# Patient Record
Sex: Female | Born: 1960 | Race: White | Hispanic: No | Smoking: Never smoker
Health system: Southern US, Community
[De-identification: ages and names within clinical notes are randomized; demographics above are authoritative.]

---

## 1982-11-02 HISTORY — PX: BREAST EXCISIONAL BIOPSY: SUR124

## 1999-07-17 ENCOUNTER — Other Ambulatory Visit: Admission: RE | Admit: 1999-07-17 | Discharge: 1999-07-17 | Payer: Self-pay | Admitting: Gynecology

## 2000-07-22 ENCOUNTER — Other Ambulatory Visit: Admission: RE | Admit: 2000-07-22 | Discharge: 2000-07-22 | Payer: Self-pay | Admitting: Gynecology

## 2001-04-22 ENCOUNTER — Encounter: Payer: Self-pay | Admitting: Gynecology

## 2001-04-22 ENCOUNTER — Encounter: Admission: RE | Admit: 2001-04-22 | Discharge: 2001-04-22 | Payer: Self-pay | Admitting: Gynecology

## 2001-08-10 ENCOUNTER — Other Ambulatory Visit: Admission: RE | Admit: 2001-08-10 | Discharge: 2001-08-10 | Payer: Self-pay | Admitting: Gynecology

## 2002-04-25 ENCOUNTER — Encounter: Admission: RE | Admit: 2002-04-25 | Discharge: 2002-04-25 | Payer: Self-pay | Admitting: Gynecology

## 2002-04-25 ENCOUNTER — Encounter: Payer: Self-pay | Admitting: Gynecology

## 2002-08-10 ENCOUNTER — Other Ambulatory Visit: Admission: RE | Admit: 2002-08-10 | Discharge: 2002-08-10 | Payer: Self-pay | Admitting: Gynecology

## 2003-03-05 ENCOUNTER — Encounter: Payer: Self-pay | Admitting: Family Medicine

## 2003-03-05 ENCOUNTER — Encounter: Admission: RE | Admit: 2003-03-05 | Discharge: 2003-03-05 | Payer: Self-pay | Admitting: Family Medicine

## 2003-07-24 ENCOUNTER — Encounter: Admission: RE | Admit: 2003-07-24 | Discharge: 2003-07-24 | Payer: Self-pay | Admitting: Gynecology

## 2003-07-24 ENCOUNTER — Encounter: Payer: Self-pay | Admitting: Gynecology

## 2003-08-22 ENCOUNTER — Other Ambulatory Visit: Admission: RE | Admit: 2003-08-22 | Discharge: 2003-08-22 | Payer: Self-pay | Admitting: Gynecology

## 2004-08-26 ENCOUNTER — Other Ambulatory Visit: Admission: RE | Admit: 2004-08-26 | Discharge: 2004-08-26 | Payer: Self-pay | Admitting: Gynecology

## 2004-09-08 ENCOUNTER — Ambulatory Visit (HOSPITAL_COMMUNITY): Admission: RE | Admit: 2004-09-08 | Discharge: 2004-09-08 | Payer: Self-pay | Admitting: Gynecology

## 2005-09-30 ENCOUNTER — Encounter: Admission: RE | Admit: 2005-09-30 | Discharge: 2005-09-30 | Payer: Self-pay | Admitting: Gynecology

## 2005-10-05 ENCOUNTER — Other Ambulatory Visit: Admission: RE | Admit: 2005-10-05 | Discharge: 2005-10-05 | Payer: Self-pay | Admitting: Gynecology

## 2006-11-05 ENCOUNTER — Encounter: Admission: RE | Admit: 2006-11-05 | Discharge: 2006-11-05 | Payer: Self-pay | Admitting: Gynecology

## 2006-11-10 ENCOUNTER — Other Ambulatory Visit: Admission: RE | Admit: 2006-11-10 | Discharge: 2006-11-10 | Payer: Self-pay | Admitting: Gynecology

## 2006-11-16 ENCOUNTER — Encounter: Admission: RE | Admit: 2006-11-16 | Discharge: 2006-11-16 | Payer: Self-pay | Admitting: Gynecology

## 2007-11-30 ENCOUNTER — Other Ambulatory Visit: Admission: RE | Admit: 2007-11-30 | Discharge: 2007-11-30 | Payer: Self-pay | Admitting: Gynecology

## 2007-12-14 ENCOUNTER — Encounter: Admission: RE | Admit: 2007-12-14 | Discharge: 2007-12-14 | Payer: Self-pay | Admitting: Gynecology

## 2007-12-16 ENCOUNTER — Encounter: Admission: RE | Admit: 2007-12-16 | Discharge: 2007-12-16 | Payer: Self-pay | Admitting: Family Medicine

## 2008-11-07 ENCOUNTER — Other Ambulatory Visit: Admission: RE | Admit: 2008-11-07 | Discharge: 2008-11-07 | Payer: Self-pay | Admitting: Gynecology

## 2008-12-24 ENCOUNTER — Encounter: Admission: RE | Admit: 2008-12-24 | Discharge: 2008-12-24 | Payer: Self-pay | Admitting: Gynecology

## 2010-01-01 ENCOUNTER — Encounter: Admission: RE | Admit: 2010-01-01 | Discharge: 2010-01-01 | Payer: Self-pay | Admitting: Gynecology

## 2011-01-02 ENCOUNTER — Other Ambulatory Visit (HOSPITAL_COMMUNITY): Payer: Self-pay | Admitting: Obstetrics and Gynecology

## 2011-01-02 DIAGNOSIS — Z1231 Encounter for screening mammogram for malignant neoplasm of breast: Secondary | ICD-10-CM

## 2011-01-14 ENCOUNTER — Ambulatory Visit
Admission: RE | Admit: 2011-01-14 | Discharge: 2011-01-14 | Disposition: A | Discharge status: Home / Self Care | Payer: BC Managed Care – PPO | Source: Ambulatory Visit | Attending: Obstetrics and Gynecology | Admitting: Obstetrics and Gynecology

## 2011-01-14 DIAGNOSIS — Z1231 Encounter for screening mammogram for malignant neoplasm of breast: Secondary | ICD-10-CM

## 2011-05-12 ENCOUNTER — Other Ambulatory Visit: Payer: Self-pay | Admitting: Neurology

## 2011-05-12 DIAGNOSIS — R0989 Other specified symptoms and signs involving the circulatory and respiratory systems: Secondary | ICD-10-CM

## 2011-05-20 ENCOUNTER — Ambulatory Visit
Admission: RE | Admit: 2011-05-20 | Discharge: 2011-05-20 | Disposition: A | Discharge status: Home / Self Care | Payer: BC Managed Care – PPO | Source: Ambulatory Visit | Attending: Neurology | Admitting: Neurology

## 2011-05-20 ENCOUNTER — Other Ambulatory Visit: Payer: BC Managed Care – PPO

## 2011-05-20 DIAGNOSIS — R0989 Other specified symptoms and signs involving the circulatory and respiratory systems: Secondary | ICD-10-CM

## 2011-05-27 ENCOUNTER — Other Ambulatory Visit: Payer: Self-pay | Admitting: Family Medicine

## 2011-05-27 DIAGNOSIS — E041 Nontoxic single thyroid nodule: Secondary | ICD-10-CM

## 2011-06-01 ENCOUNTER — Ambulatory Visit
Admission: RE | Admit: 2011-06-01 | Discharge: 2011-06-01 | Disposition: A | Discharge status: Home / Self Care | Payer: BC Managed Care – PPO | Source: Ambulatory Visit | Attending: Family Medicine | Admitting: Family Medicine

## 2011-06-01 DIAGNOSIS — E041 Nontoxic single thyroid nodule: Secondary | ICD-10-CM

## 2012-01-26 ENCOUNTER — Other Ambulatory Visit: Payer: Self-pay | Admitting: Obstetrics and Gynecology

## 2012-01-26 DIAGNOSIS — Z1231 Encounter for screening mammogram for malignant neoplasm of breast: Secondary | ICD-10-CM

## 2012-01-29 ENCOUNTER — Ambulatory Visit
Admission: RE | Admit: 2012-01-29 | Discharge: 2012-01-29 | Disposition: A | Discharge status: Home / Self Care | Payer: 59 | Source: Ambulatory Visit | Attending: Obstetrics and Gynecology | Admitting: Obstetrics and Gynecology

## 2012-01-29 DIAGNOSIS — Z1231 Encounter for screening mammogram for malignant neoplasm of breast: Secondary | ICD-10-CM

## 2012-11-03 IMAGING — US US CAROTID DUPLEX BILAT
1 series · 13 of 24 positions shown · non-contrast
Comparison: 03/05/2003 by report only

CLINICAL DATA: Carotid bruit

BILATERAL CAROTID DUPLEX ULTRASOUND
TECHNIQUE: Gray scale imaging, color Doppler and duplex ultrasound
was performed of bilateral carotid and vertebral arteries in the
neck.

[Series 1: us carotid duplex bilat · 0.07mm/px · 13 of 65 slices shown]
[im 1/65]
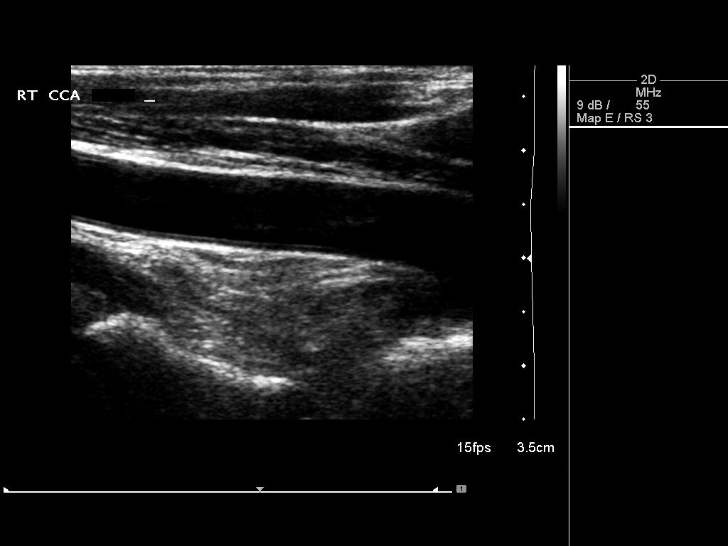
[im 6/65]
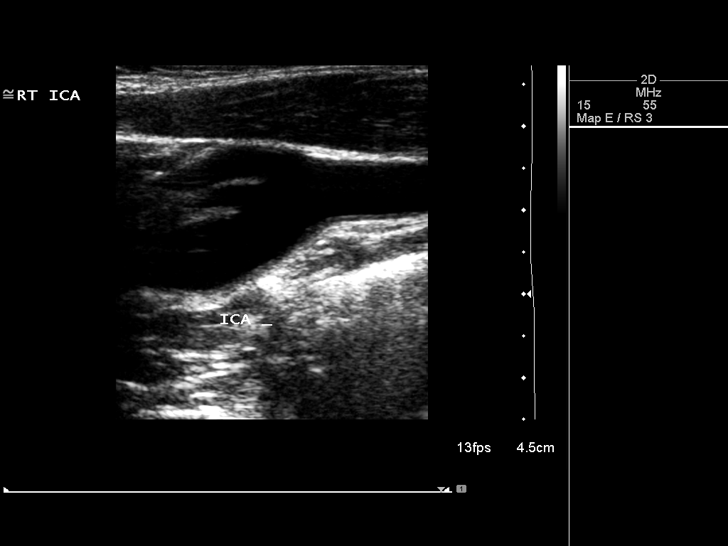
[im 12/65]
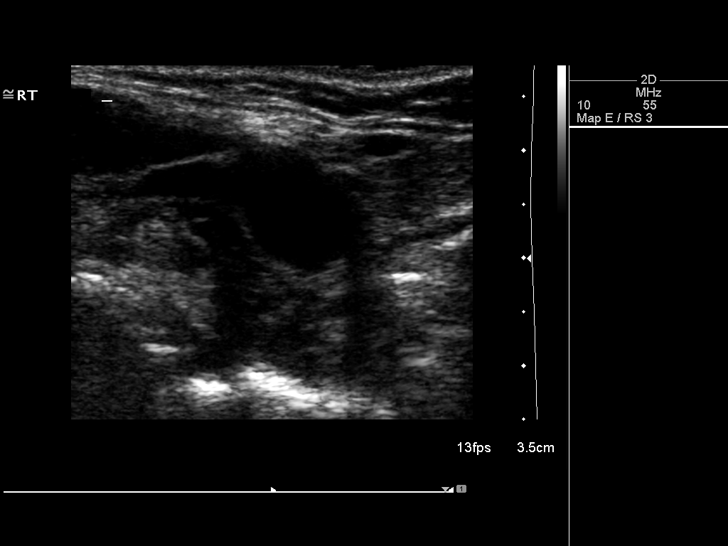
[im 17/65]
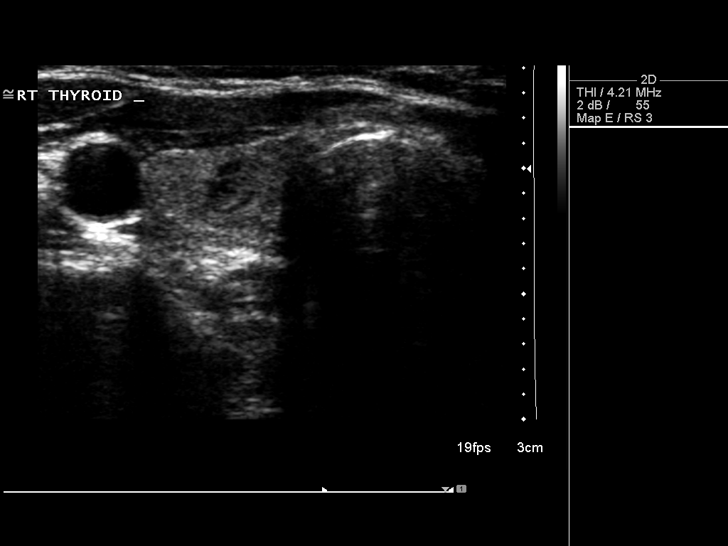
[im 23/65]
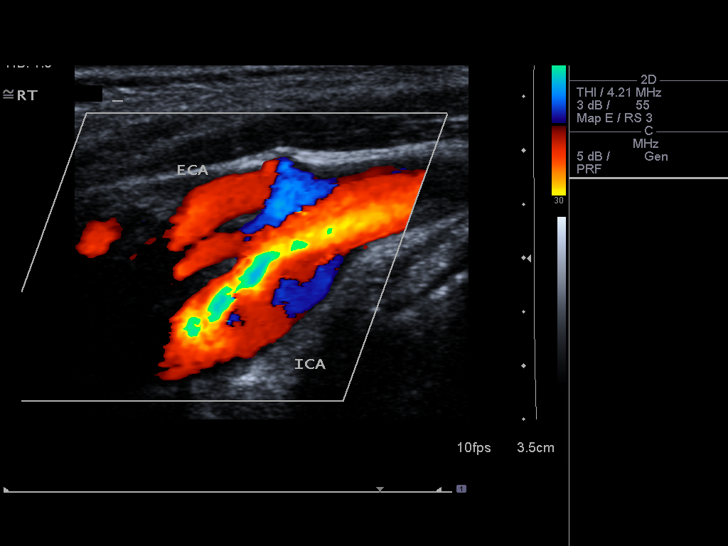
[im 28/65]
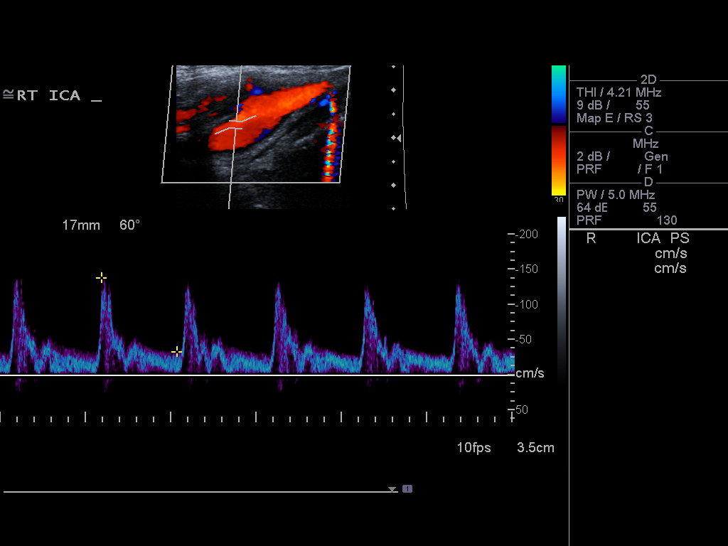
[im 34/65]
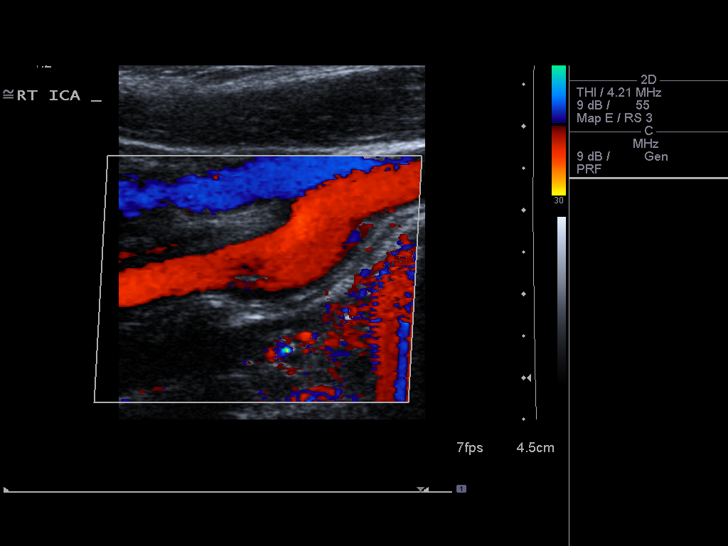
[im 37/65]
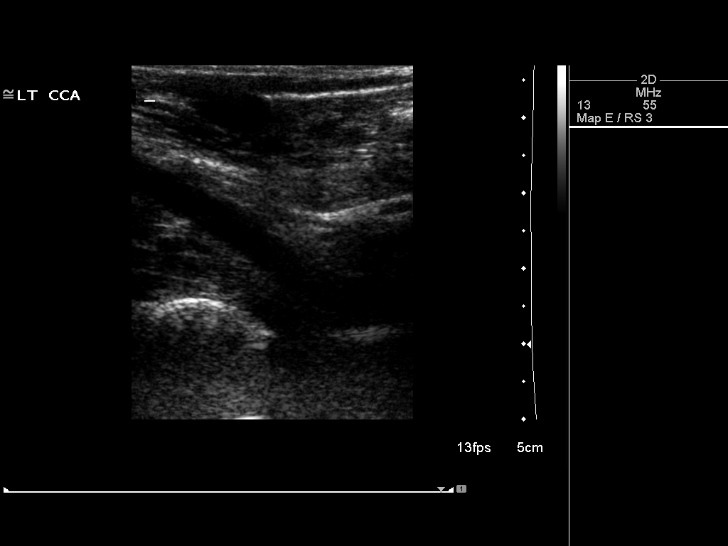
[im 42/65]
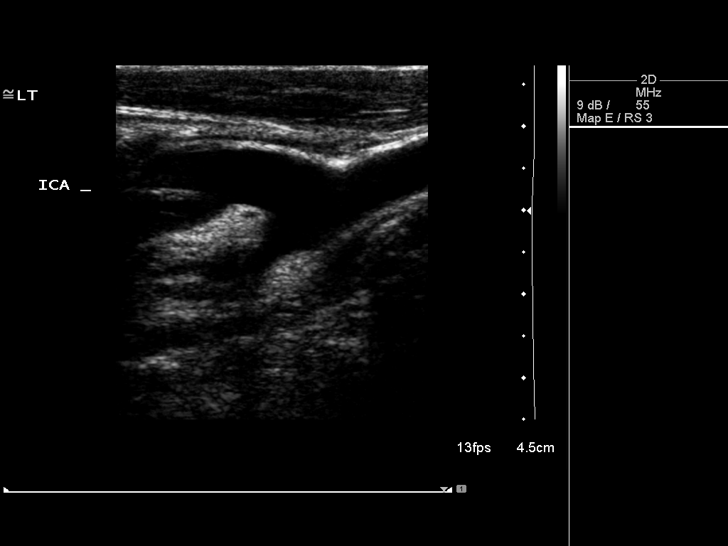
[im 48/65]
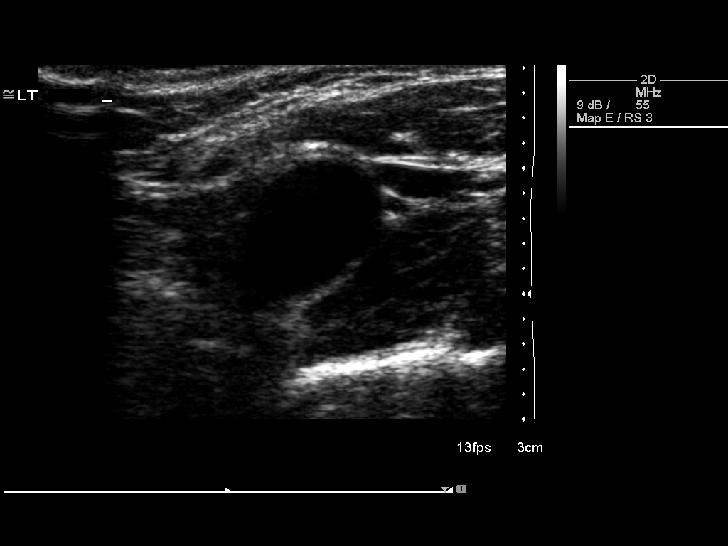
[im 53/65]
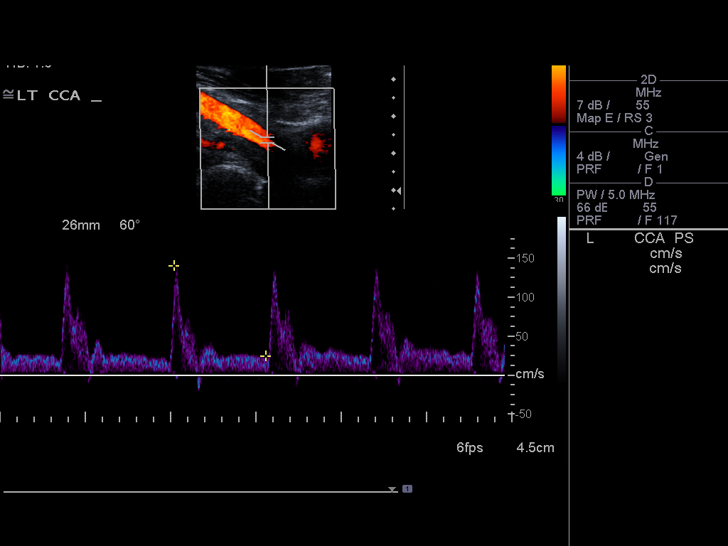
[im 59/65]
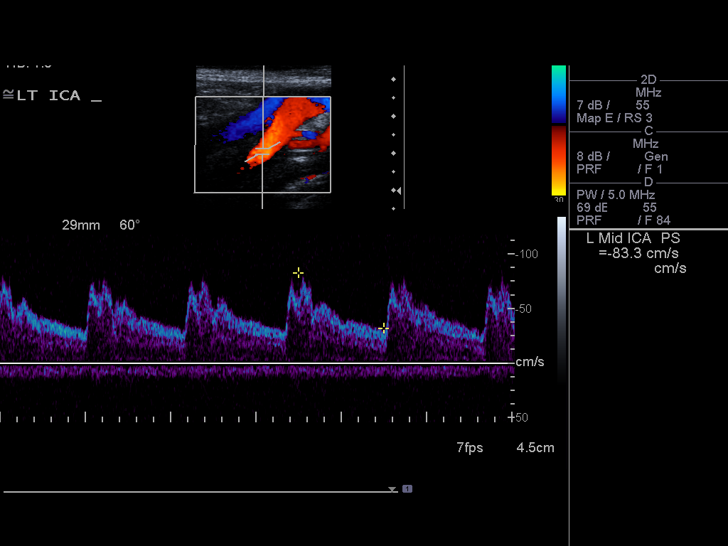
[im 65/65]
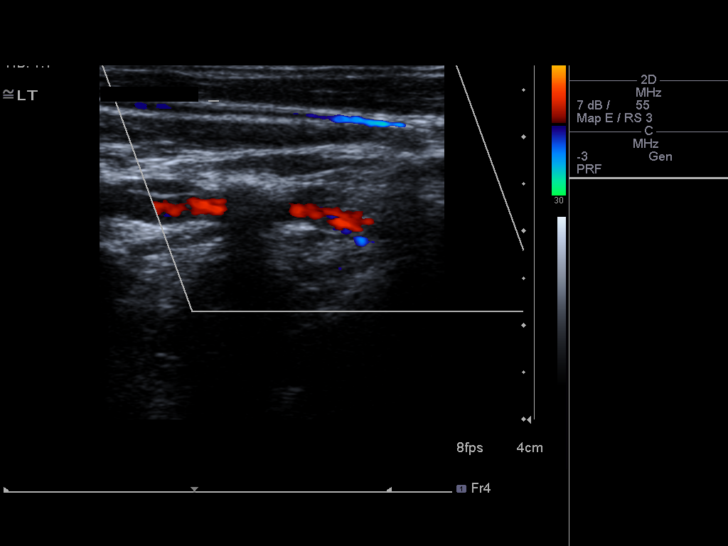

[13 of 24 positions shown; findings below may reference images not displayed]

Criteria:  Quantification of carotid stenosis is based on velocity
parameters that correlate the residual internal carotid diameter
with NASCET-based stenosis levels, using the diameter of the distal
internal carotid lumen as the denominator for stenosis measurement.

The following velocity measurements were obtained:

                 PEAK SYSTOLIC/END DIASTOLIC
RIGHT
ICA:                        139/33cm/sec
CCA:                        130/24cm/sec
SYSTOLIC ICA/CCA RATIO:
DIASTOLIC ICA/CCA RATIO:
ECA:                        72cm/sec

LEFT
ICA:                        83/33cm/sec
CCA:                        140/25cm/sec
SYSTOLIC ICA/CCA RATIO:
DIASTOLIC ICA/CCA RATIO:
ECA:                        133cm/sec
FINDINGS: RIGHT CAROTID ARTERY: Hypoechoic right thyroid nodule incidentally
noted.  No significant plaque accumulation or stenosis.  Normal
wave forms and color Doppler signal.

RIGHT VERTEBRAL ARTERY:  Normal flow direction and waveform.

LEFT CAROTID ARTERY: No significant plaque accumulation or
stenosis.  Normal wave forms and color Doppler signal.  Hypoechoic
thyroid nodule noted.

LEFT VERTEBRAL ARTERY:  Normal flow direction and waveform.
IMPRESSION: 1.  No significant carotid bifurcation plaque or stenosis.
2.  Bilateral thyroid nodules, incompletely characterized.
Consider dedicated thyroid ultrasound for further evaluation.

## 2013-01-30 ENCOUNTER — Other Ambulatory Visit: Payer: Self-pay

## 2013-01-30 DIAGNOSIS — Z1231 Encounter for screening mammogram for malignant neoplasm of breast: Secondary | ICD-10-CM

## 2013-02-01 ENCOUNTER — Ambulatory Visit
Admission: RE | Admit: 2013-02-01 | Discharge: 2013-02-01 | Disposition: A | Discharge status: Home / Self Care | Payer: 59 | Source: Ambulatory Visit

## 2013-02-01 DIAGNOSIS — Z1231 Encounter for screening mammogram for malignant neoplasm of breast: Secondary | ICD-10-CM

## 2014-01-01 ENCOUNTER — Other Ambulatory Visit: Payer: Self-pay

## 2014-01-01 DIAGNOSIS — Z1231 Encounter for screening mammogram for malignant neoplasm of breast: Secondary | ICD-10-CM

## 2014-02-02 ENCOUNTER — Ambulatory Visit
Admission: RE | Admit: 2014-02-02 | Discharge: 2014-02-02 | Disposition: A | Discharge status: Home / Self Care | Payer: 59 | Source: Ambulatory Visit

## 2014-02-02 DIAGNOSIS — Z1231 Encounter for screening mammogram for malignant neoplasm of breast: Secondary | ICD-10-CM

## 2015-01-18 ENCOUNTER — Other Ambulatory Visit: Payer: Self-pay

## 2015-01-18 DIAGNOSIS — Z1231 Encounter for screening mammogram for malignant neoplasm of breast: Secondary | ICD-10-CM

## 2015-02-06 ENCOUNTER — Ambulatory Visit
Admission: RE | Admit: 2015-02-06 | Discharge: 2015-02-06 | Disposition: A | Discharge status: Home / Self Care | Payer: 59 | Source: Ambulatory Visit

## 2015-02-06 ENCOUNTER — Encounter (INDEPENDENT_AMBULATORY_CARE_PROVIDER_SITE_OTHER): Payer: Self-pay

## 2015-02-06 DIAGNOSIS — Z1231 Encounter for screening mammogram for malignant neoplasm of breast: Secondary | ICD-10-CM

## 2015-12-30 ENCOUNTER — Other Ambulatory Visit: Payer: Self-pay

## 2015-12-30 DIAGNOSIS — Z1231 Encounter for screening mammogram for malignant neoplasm of breast: Secondary | ICD-10-CM

## 2016-02-07 ENCOUNTER — Ambulatory Visit
Admission: RE | Admit: 2016-02-07 | Discharge: 2016-02-07 | Disposition: A | Discharge status: Home / Self Care | Payer: 59 | Source: Ambulatory Visit

## 2016-02-07 DIAGNOSIS — Z1231 Encounter for screening mammogram for malignant neoplasm of breast: Secondary | ICD-10-CM

## 2016-02-24 ENCOUNTER — Other Ambulatory Visit: Payer: Self-pay | Admitting: Obstetrics and Gynecology

## 2016-02-24 DIAGNOSIS — Z803 Family history of malignant neoplasm of breast: Secondary | ICD-10-CM

## 2016-03-11 ENCOUNTER — Ambulatory Visit
Admission: RE | Admit: 2016-03-11 | Discharge: 2016-03-11 | Disposition: A | Discharge status: Home / Self Care | Payer: 59 | Source: Ambulatory Visit | Attending: Obstetrics and Gynecology | Admitting: Obstetrics and Gynecology

## 2016-03-11 DIAGNOSIS — Z803 Family history of malignant neoplasm of breast: Secondary | ICD-10-CM

## 2016-03-11 MED ORDER — GADOBENATE DIMEGLUMINE 529 MG/ML IV SOLN
13.0000 mL | Freq: Once | INTRAVENOUS | Status: AC | PRN
  Administered 2016-03-11: 13 mL via INTRAVENOUS

## 2016-12-16 DIAGNOSIS — H43812 Vitreous degeneration, left eye: Secondary | ICD-10-CM | POA: Diagnosis not present

## 2016-12-25 DIAGNOSIS — J069 Acute upper respiratory infection, unspecified: Secondary | ICD-10-CM | POA: Diagnosis not present

## 2016-12-25 DIAGNOSIS — M79645 Pain in left finger(s): Secondary | ICD-10-CM | POA: Diagnosis not present

## 2017-01-25 ENCOUNTER — Other Ambulatory Visit: Payer: Self-pay | Admitting: Obstetrics and Gynecology

## 2017-01-25 DIAGNOSIS — Z1231 Encounter for screening mammogram for malignant neoplasm of breast: Secondary | ICD-10-CM

## 2017-02-17 ENCOUNTER — Ambulatory Visit
Admission: RE | Admit: 2017-02-17 | Discharge: 2017-02-17 | Disposition: A | Discharge status: Home / Self Care | Payer: 59 | Source: Ambulatory Visit | Attending: Obstetrics and Gynecology | Admitting: Obstetrics and Gynecology

## 2017-02-17 DIAGNOSIS — Z1231 Encounter for screening mammogram for malignant neoplasm of breast: Secondary | ICD-10-CM | POA: Diagnosis not present

## 2017-02-22 DIAGNOSIS — Z6820 Body mass index (BMI) 20.0-20.9, adult: Secondary | ICD-10-CM | POA: Diagnosis not present

## 2017-02-22 DIAGNOSIS — Z01419 Encounter for gynecological examination (general) (routine) without abnormal findings: Secondary | ICD-10-CM | POA: Diagnosis not present

## 2017-02-24 DIAGNOSIS — Z85820 Personal history of malignant melanoma of skin: Secondary | ICD-10-CM | POA: Diagnosis not present

## 2017-02-24 DIAGNOSIS — D225 Melanocytic nevi of trunk: Secondary | ICD-10-CM | POA: Diagnosis not present

## 2017-02-24 DIAGNOSIS — L814 Other melanin hyperpigmentation: Secondary | ICD-10-CM | POA: Diagnosis not present

## 2017-02-24 DIAGNOSIS — D485 Neoplasm of uncertain behavior of skin: Secondary | ICD-10-CM | POA: Diagnosis not present

## 2017-02-24 DIAGNOSIS — D2262 Melanocytic nevi of left upper limb, including shoulder: Secondary | ICD-10-CM | POA: Diagnosis not present

## 2017-07-22 DIAGNOSIS — Z01419 Encounter for gynecological examination (general) (routine) without abnormal findings: Secondary | ICD-10-CM | POA: Diagnosis not present

## 2017-07-22 DIAGNOSIS — R300 Dysuria: Secondary | ICD-10-CM | POA: Diagnosis not present

## 2017-07-22 DIAGNOSIS — N760 Acute vaginitis: Secondary | ICD-10-CM | POA: Diagnosis not present

## 2017-08-18 DIAGNOSIS — E7800 Pure hypercholesterolemia, unspecified: Secondary | ICD-10-CM | POA: Diagnosis not present

## 2017-08-18 DIAGNOSIS — Z0000 Encounter for general adult medical examination without abnormal findings: Secondary | ICD-10-CM | POA: Diagnosis not present

## 2017-09-13 DIAGNOSIS — D229 Melanocytic nevi, unspecified: Secondary | ICD-10-CM | POA: Diagnosis not present

## 2017-09-13 DIAGNOSIS — L821 Other seborrheic keratosis: Secondary | ICD-10-CM | POA: Diagnosis not present

## 2017-09-13 DIAGNOSIS — L814 Other melanin hyperpigmentation: Secondary | ICD-10-CM | POA: Diagnosis not present

## 2018-03-16 DIAGNOSIS — D229 Melanocytic nevi, unspecified: Secondary | ICD-10-CM | POA: Diagnosis not present

## 2018-03-16 DIAGNOSIS — L814 Other melanin hyperpigmentation: Secondary | ICD-10-CM | POA: Diagnosis not present

## 2018-03-16 DIAGNOSIS — L570 Actinic keratosis: Secondary | ICD-10-CM | POA: Diagnosis not present

## 2018-03-16 DIAGNOSIS — L821 Other seborrheic keratosis: Secondary | ICD-10-CM | POA: Diagnosis not present

## 2018-04-04 ENCOUNTER — Other Ambulatory Visit: Payer: Self-pay | Admitting: Obstetrics and Gynecology

## 2018-04-04 DIAGNOSIS — Z01419 Encounter for gynecological examination (general) (routine) without abnormal findings: Secondary | ICD-10-CM | POA: Diagnosis not present

## 2018-04-04 DIAGNOSIS — Z6820 Body mass index (BMI) 20.0-20.9, adult: Secondary | ICD-10-CM | POA: Diagnosis not present

## 2018-04-04 DIAGNOSIS — Z1231 Encounter for screening mammogram for malignant neoplasm of breast: Secondary | ICD-10-CM

## 2018-05-02 ENCOUNTER — Ambulatory Visit
Admission: RE | Admit: 2018-05-02 | Discharge: 2018-05-02 | Disposition: A | Discharge status: Home / Self Care | Payer: 59 | Source: Ambulatory Visit | Attending: Obstetrics and Gynecology | Admitting: Obstetrics and Gynecology

## 2018-05-02 DIAGNOSIS — Z1231 Encounter for screening mammogram for malignant neoplasm of breast: Secondary | ICD-10-CM

## 2018-09-28 DIAGNOSIS — S0501XA Injury of conjunctiva and corneal abrasion without foreign body, right eye, initial encounter: Secondary | ICD-10-CM | POA: Diagnosis not present

## 2018-10-11 DIAGNOSIS — B029 Zoster without complications: Secondary | ICD-10-CM | POA: Diagnosis not present

## 2018-11-18 DIAGNOSIS — Z0000 Encounter for general adult medical examination without abnormal findings: Secondary | ICD-10-CM | POA: Diagnosis not present

## 2018-11-18 DIAGNOSIS — Z1159 Encounter for screening for other viral diseases: Secondary | ICD-10-CM | POA: Diagnosis not present

## 2018-11-18 DIAGNOSIS — E7800 Pure hypercholesterolemia, unspecified: Secondary | ICD-10-CM | POA: Diagnosis not present

## 2018-12-08 DIAGNOSIS — C4371 Malignant melanoma of right lower limb, including hip: Secondary | ICD-10-CM | POA: Diagnosis not present

## 2018-12-08 DIAGNOSIS — C4372 Malignant melanoma of left lower limb, including hip: Secondary | ICD-10-CM | POA: Diagnosis not present

## 2018-12-08 DIAGNOSIS — D2261 Melanocytic nevi of right upper limb, including shoulder: Secondary | ICD-10-CM | POA: Diagnosis not present

## 2018-12-08 DIAGNOSIS — D225 Melanocytic nevi of trunk: Secondary | ICD-10-CM | POA: Diagnosis not present

## 2018-12-08 DIAGNOSIS — D485 Neoplasm of uncertain behavior of skin: Secondary | ICD-10-CM | POA: Diagnosis not present

## 2018-12-08 DIAGNOSIS — L821 Other seborrheic keratosis: Secondary | ICD-10-CM | POA: Diagnosis not present

## 2018-12-08 DIAGNOSIS — D2262 Melanocytic nevi of left upper limb, including shoulder: Secondary | ICD-10-CM | POA: Diagnosis not present

## 2018-12-15 DIAGNOSIS — C4372 Malignant melanoma of left lower limb, including hip: Secondary | ICD-10-CM | POA: Diagnosis not present

## 2018-12-15 DIAGNOSIS — L97829 Non-pressure chronic ulcer of other part of left lower leg with unspecified severity: Secondary | ICD-10-CM | POA: Diagnosis not present

## 2018-12-15 DIAGNOSIS — L97119 Non-pressure chronic ulcer of right thigh with unspecified severity: Secondary | ICD-10-CM | POA: Diagnosis not present

## 2018-12-15 DIAGNOSIS — C4371 Malignant melanoma of right lower limb, including hip: Secondary | ICD-10-CM | POA: Diagnosis not present

## 2019-02-01 DIAGNOSIS — D2261 Melanocytic nevi of right upper limb, including shoulder: Secondary | ICD-10-CM | POA: Diagnosis not present

## 2019-02-01 DIAGNOSIS — D225 Melanocytic nevi of trunk: Secondary | ICD-10-CM | POA: Diagnosis not present

## 2019-02-01 DIAGNOSIS — C44519 Basal cell carcinoma of skin of other part of trunk: Secondary | ICD-10-CM | POA: Diagnosis not present

## 2019-02-01 DIAGNOSIS — D2262 Melanocytic nevi of left upper limb, including shoulder: Secondary | ICD-10-CM | POA: Diagnosis not present

## 2019-02-01 DIAGNOSIS — L570 Actinic keratosis: Secondary | ICD-10-CM | POA: Diagnosis not present

## 2019-02-01 DIAGNOSIS — D485 Neoplasm of uncertain behavior of skin: Secondary | ICD-10-CM | POA: Diagnosis not present

## 2019-02-01 DIAGNOSIS — Z85820 Personal history of malignant melanoma of skin: Secondary | ICD-10-CM | POA: Diagnosis not present

## 2019-02-01 DIAGNOSIS — C44712 Basal cell carcinoma of skin of right lower limb, including hip: Secondary | ICD-10-CM | POA: Diagnosis not present

## 2019-02-15 DIAGNOSIS — L988 Other specified disorders of the skin and subcutaneous tissue: Secondary | ICD-10-CM | POA: Diagnosis not present

## 2019-02-15 DIAGNOSIS — D485 Neoplasm of uncertain behavior of skin: Secondary | ICD-10-CM | POA: Diagnosis not present

## 2019-03-29 ENCOUNTER — Other Ambulatory Visit: Payer: Self-pay | Admitting: Obstetrics and Gynecology

## 2019-03-29 DIAGNOSIS — Z1231 Encounter for screening mammogram for malignant neoplasm of breast: Secondary | ICD-10-CM

## 2019-04-19 ENCOUNTER — Other Ambulatory Visit: Payer: Self-pay | Admitting: Obstetrics and Gynecology

## 2019-04-19 DIAGNOSIS — Z803 Family history of malignant neoplasm of breast: Secondary | ICD-10-CM

## 2019-05-25 ENCOUNTER — Other Ambulatory Visit: Payer: Self-pay

## 2019-05-25 ENCOUNTER — Ambulatory Visit
Admission: RE | Admit: 2019-05-25 | Discharge: 2019-05-25 | Disposition: A | Discharge status: Home / Self Care | Payer: 59 | Source: Ambulatory Visit | Attending: Obstetrics and Gynecology | Admitting: Obstetrics and Gynecology

## 2019-05-25 DIAGNOSIS — Z1231 Encounter for screening mammogram for malignant neoplasm of breast: Secondary | ICD-10-CM

## 2019-09-08 ENCOUNTER — Other Ambulatory Visit: Payer: Self-pay

## 2019-09-08 ENCOUNTER — Ambulatory Visit
Admission: RE | Admit: 2019-09-08 | Discharge: 2019-09-08 | Disposition: A | Discharge status: Home / Self Care | Payer: 59 | Source: Ambulatory Visit | Attending: Obstetrics and Gynecology | Admitting: Obstetrics and Gynecology

## 2019-09-08 DIAGNOSIS — Z803 Family history of malignant neoplasm of breast: Secondary | ICD-10-CM

## 2019-09-08 MED ORDER — GADOBUTROL 1 MMOL/ML IV SOLN
5.0000 mL | Freq: Once | INTRAVENOUS | Status: AC | PRN
  Administered 2019-09-08: 5 mL via INTRAVENOUS

## 2020-04-23 ENCOUNTER — Other Ambulatory Visit: Payer: Self-pay | Admitting: Obstetrics and Gynecology

## 2020-04-23 DIAGNOSIS — Z1231 Encounter for screening mammogram for malignant neoplasm of breast: Secondary | ICD-10-CM

## 2020-05-28 ENCOUNTER — Ambulatory Visit
Admission: RE | Admit: 2020-05-28 | Discharge: 2020-05-28 | Disposition: A | Discharge status: Home / Self Care | Payer: 59 | Source: Ambulatory Visit | Attending: Obstetrics and Gynecology | Admitting: Obstetrics and Gynecology

## 2020-05-28 ENCOUNTER — Other Ambulatory Visit: Payer: Self-pay

## 2020-05-28 DIAGNOSIS — Z1231 Encounter for screening mammogram for malignant neoplasm of breast: Secondary | ICD-10-CM

## 2020-05-29 ENCOUNTER — Ambulatory Visit: Payer: 59

## 2021-03-16 ENCOUNTER — Emergency Department (HOSPITAL_BASED_OUTPATIENT_CLINIC_OR_DEPARTMENT_OTHER)
Admission: EM | Admit: 2021-03-16 | Discharge: 2021-03-16 | Disposition: A | Discharge status: Home / Self Care | Payer: 59 | Attending: Emergency Medicine | Admitting: Emergency Medicine

## 2021-03-16 ENCOUNTER — Encounter (HOSPITAL_BASED_OUTPATIENT_CLINIC_OR_DEPARTMENT_OTHER): Payer: Self-pay | Admitting: Emergency Medicine

## 2021-03-16 ENCOUNTER — Other Ambulatory Visit: Payer: Self-pay

## 2021-03-16 DIAGNOSIS — S6991XA Unspecified injury of right wrist, hand and finger(s), initial encounter: Secondary | ICD-10-CM | POA: Diagnosis present

## 2021-03-16 DIAGNOSIS — W293XXA Contact with powered garden and outdoor hand tools and machinery, initial encounter: Secondary | ICD-10-CM | POA: Insufficient documentation

## 2021-03-16 DIAGNOSIS — S61210A Laceration without foreign body of right index finger without damage to nail, initial encounter: Secondary | ICD-10-CM | POA: Diagnosis not present

## 2021-03-16 MED ORDER — LIDOCAINE HCL 2 % IJ SOLN
10.0000 mL | Freq: Once | INTRAMUSCULAR | Status: AC
  Administered 2021-03-16: 200 mg
  Filled 2021-03-16: qty 20

## 2021-03-16 NOTE — ED Triage Notes (Signed)
Pt arrives to ED with c/o of laceration to right index finger after cutting it on hedge trimmer. Bleeding controlled with gauze. Able to bend finger.

## 2021-03-16 NOTE — ED Notes (Signed)
Patient verbalizes understanding of discharge instructions. Opportunity for questioning and answers were provided. Armband removed by staff, pt discharged from ED.  

## 2021-03-16 NOTE — ED Provider Notes (Signed)
[facility] Provider Note  CSN: [id number] Arrival date & time: 03/16/21 1357    History Chief Complaint  Patient presents with  . Finger Laceration     HPI  Carolyn Sweeney is a 60 y.o. female reports just prior to arrival she was using a hedge trimmer when she got her R index finger caught in the blade and sustained a laceration to the end of the finger. Bleeding controlled at home. TDAP is UTD.    History reviewed. No pertinent past medical history.  Past Surgical History:  Procedure Laterality Date  . BREAST EXCISIONAL BIOPSY Left 1984    History reviewed. No pertinent family history.      Home Medications Prior to Admission medications   Not on File     Allergies    Codeine   Review of Systems   Review of Systems  Skin: Positive for wound.  Neurological: Positive for numbness.     Physical Exam BP (!) 145/77 (BP Location: Left Arm)   Pulse 88   Temp 98.2 F (36.8 C) (Oral)   Resp 14   Ht 5\' 8"  (1.727 m)   Wt 63 kg   SpO2 100%   BMI 21.13 kg/m   Physical Exam Vitals and nursing note reviewed.  HENT:     Head: Normocephalic.     Nose: Nose normal.  Eyes:     Extraocular Movements: Extraocular movements intact.  Pulmonary:     Effort: Pulmonary effort is normal.  Musculoskeletal:        General: Normal range of motion.     Cervical back: Neck supple.     Comments: 2cm laceration to the palmar-radial aspect of the distal 2nd finger on the R without nailbed involvement. Able to flex against resistance, mild decreased sensation distally.  Skin:    Findings: No rash (on exposed skin).  Neurological:     Mental Status: She is alert and oriented to person, place, and time.  Psychiatric:        Mood and Affect: Mood normal.      ED Results / Procedures / Treatments   Labs (all labs ordered are listed, but only abnormal results are displayed) Labs Reviewed - No data to  display  EKG None   Radiology No results found.  Procedures .Marland KitchenLaceration Repair  Date/Time: 03/16/2021 3:00 PM Performed by: Truddie Hidden, MD Authorized by: Truddie Hidden, MD   Consent:    Consent obtained:  Verbal   Consent given by:  Patient   Risks discussed:  Pain, infection and nerve damage   Alternatives discussed:  No treatment Universal protocol:    Patient identity confirmed:  Verbally with patient Anesthesia:    Anesthesia method:  Nerve block   Block location:  R second digit   Block anesthetic:  Lidocaine 2% w/o epi   Block technique:  Digital   Block injection procedure:  Anatomic landmarks identified   Block outcome:  Anesthesia achieved Laceration details:    Location:  Finger   Finger location:  R index finger   Length (cm):  2 Pre-procedure details:    Preparation:  Patient was prepped and draped in usual sterile fashion Exploration:    Imaging outcome: foreign body not noted     Wound exploration: wound explored through full range of motion     Contaminated: no   Treatment:    Area cleansed with:  Povidone-iodine   Amount of cleaning:  Standard   Irrigation solution:  Sterile saline   Irrigation method:  Syringe   Debridement:  None Skin repair:    Repair method:  Sutures   Suture size:  5-0   Suture material:  Nylon   Suture technique:  Simple interrupted   Number of sutures:  3 Approximation:    Approximation:  Close Repair type:    Repair type:  Simple    Medications Ordered in the ED Medications  lidocaine (XYLOCAINE) 2 % (with pres) injection 200 mg (has no administration in time range)     MDM Rules/Calculators/A&P MDM Wound repaired, wound care advise given. Nonstick dressing to be applied by RN. Suture removal in 7 days ED Course  I have reviewed the triage vital signs and the nursing notes.  Pertinent labs & imaging results that were available during my care of the patient were reviewed by me and considered in  my medical decision making (see chart for details).     Final Clinical Impression(s) / ED Diagnoses Final diagnoses:  Laceration of right index finger without foreign body without damage to nail, initial encounter    Rx / DC Orders ED Discharge Orders    None       Truddie Hidden, MD 03/16/21 [phone]

## 2021-04-17 ENCOUNTER — Other Ambulatory Visit: Payer: Self-pay | Admitting: Obstetrics and Gynecology

## 2021-04-17 DIAGNOSIS — Z1231 Encounter for screening mammogram for malignant neoplasm of breast: Secondary | ICD-10-CM

## 2021-06-16 ENCOUNTER — Ambulatory Visit
Admission: RE | Admit: 2021-06-16 | Discharge: 2021-06-16 | Disposition: A | Discharge status: Home / Self Care | Payer: 59 | Source: Ambulatory Visit | Attending: Obstetrics and Gynecology | Admitting: Obstetrics and Gynecology

## 2021-06-16 ENCOUNTER — Other Ambulatory Visit: Payer: Self-pay

## 2021-06-16 DIAGNOSIS — Z1231 Encounter for screening mammogram for malignant neoplasm of breast: Secondary | ICD-10-CM

## 2021-09-24 ENCOUNTER — Encounter (HOSPITAL_BASED_OUTPATIENT_CLINIC_OR_DEPARTMENT_OTHER): Payer: Self-pay | Admitting: Emergency Medicine

## 2021-09-24 ENCOUNTER — Emergency Department (HOSPITAL_BASED_OUTPATIENT_CLINIC_OR_DEPARTMENT_OTHER): Payer: 59

## 2021-09-24 ENCOUNTER — Other Ambulatory Visit: Payer: Self-pay

## 2021-09-24 ENCOUNTER — Emergency Department (HOSPITAL_BASED_OUTPATIENT_CLINIC_OR_DEPARTMENT_OTHER)
Admission: EM | Admit: 2021-09-24 | Discharge: 2021-09-24 | Disposition: A | Discharge status: Home / Self Care | Payer: 59 | Attending: Emergency Medicine | Admitting: Emergency Medicine

## 2021-09-24 DIAGNOSIS — S2242XA Multiple fractures of ribs, left side, initial encounter for closed fracture: Secondary | ICD-10-CM | POA: Diagnosis not present

## 2021-09-24 DIAGNOSIS — W11XXXA Fall on and from ladder, initial encounter: Secondary | ICD-10-CM | POA: Diagnosis not present

## 2021-09-24 DIAGNOSIS — S299XXA Unspecified injury of thorax, initial encounter: Secondary | ICD-10-CM | POA: Diagnosis present

## 2021-09-24 NOTE — Discharge Instructions (Signed)
Call your primary care doctor or specialist as discussed in the next 2-3 days.   Return immediately back to the ER if:  Your symptoms worsen within the next 12-24 hours. You develop new symptoms such as new fevers, persistent vomiting, new pain, shortness of breath, or new weakness or numbness, or if you have any other concerns.  

## 2021-09-24 NOTE — ED Triage Notes (Signed)
Reports falling about five feet from a ladder this afternoon landing on left shoulder.  Having pain in the left shoulder and neck.  Took advil 200mg  at home and used ice with some relief.

## 2021-09-24 NOTE — ED Provider Notes (Signed)
[facility] Provider Note   CSN: [id number] Arrival date & time: 09/24/21  1850     History Chief Complaint  Patient presents with   Carolyn Sweeney is a 60 y.o. female.  Patient presents with complaint of left-sided pain.  She was climbing a ladder and fell down and landed on the ladder.  Complaining of left-sided pain.  Describes a sharp and aching worse when she moves her body a certain way.  Denies headache denies loss of consciousness denies fevers or cough or vomiting or diarrhea.      History reviewed. No pertinent past medical history.  There are no problems to display for this patient.   Past Surgical History:  Procedure Laterality Date   BREAST EXCISIONAL BIOPSY Left 1984     OB History   No obstetric history on file.     No family history on file.  Social History   Tobacco Use   Smoking status: Never   Smokeless tobacco: Never  Substance Use Topics   Alcohol use: Never   Drug use: Never    Home Medications Prior to Admission medications   Not on File    Allergies    Codeine  Review of Systems   Review of Systems  Constitutional:  Negative for fever.  HENT:  Negative for ear pain.   Eyes:  Negative for pain.  Respiratory:  Negative for cough.   Cardiovascular:  Negative for chest pain.  Gastrointestinal:  Negative for abdominal pain.  Genitourinary:  Positive for flank pain.  Musculoskeletal:  Negative for back pain.  Skin:  Negative for rash.  Neurological:  Negative for headaches.   Physical Exam Updated Vital Signs BP 132/80 (BP Location: Right Arm)   Pulse 84   Temp 98 F (36.7 C) (Oral)   Resp 18   Ht 5\' 8"  (1.727 m)   Wt 63 kg   SpO2 100%   BMI 21.12 kg/m   Physical Exam Constitutional:      General: She is not in acute distress.    Appearance: Normal appearance.  HENT:     Head: Normocephalic.     Nose: Nose normal.  Eyes:     Extraocular Movements: Extraocular  movements intact.  Cardiovascular:     Rate and Rhythm: Normal rate.  Pulmonary:     Effort: Pulmonary effort is normal.  Musculoskeletal:     Cervical back: Normal range of motion.     Comments: Tenderness palpation of the left upper flank region.  No C or T or L-spine midline step-offs or tenderness noted.  No other extremity tenderness or pain noted.  Neurological:     General: No focal deficit present.     Mental Status: She is alert. Mental status is at baseline.    ED Results / Procedures / Treatments   Labs (all labs ordered are listed, but only abnormal results are displayed) Labs Reviewed - No data to display  EKG None  Radiology CT Chest Wo Contrast  Result Date: 09/24/2021 CLINICAL DATA:  Fall from ladder this afternoon with shoulder and neck pain, initial encounter EXAM: CT CHEST WITHOUT CONTRAST TECHNIQUE: Multidetector CT imaging of the chest was performed following the standard protocol without IV contrast. COMPARISON:  None. FINDINGS: Cardiovascular: Thoracic aorta shows no aneurysmal dilatation. No cardiac enlargement is noted. No significant atherosclerotic calcifications are seen. No coronary calcifications are noted. The pulmonary artery is not significantly enlarged. Mediastinum/Nodes: Thoracic inlet is within normal limits.  No sizable hilar or mediastinal adenopathy is noted. The esophagus as visualized is within normal limits. Lungs/Pleura: Lungs are well aerated bilaterally. No focal infiltrate or sizable effusion is seen. No sizable parenchymal nodules are noted. Upper Abdomen: Visualized upper abdomen shows left peripelvic cyst incompletely evaluated on this exam. Small hepatic cyst is noted within the medial segment of the left lobe. Musculoskeletal: Degenerative changes of the thoracic spine are noted. Undisplaced fractures of the left seventh rib laterally and left eighth rib medially at its articulation with the vertebral body. IMPRESSION: Fractures of the left  seventh and eighth ribs without significant displacement as described. No other acute abnormality is noted. Electronically Signed   By: Inez Catalina M.D.   On: 09/24/2021 19:49   CT Cervical Spine Wo Contrast  Result Date: 09/24/2021 CLINICAL DATA:  Neck trauma, midline tenderness (Age 22-64y). Fall from ladder. EXAM: CT CERVICAL SPINE WITHOUT CONTRAST TECHNIQUE: Multidetector CT imaging of the cervical spine was performed without intravenous contrast. Multiplanar CT image reconstructions were also generated. COMPARISON:  None. FINDINGS: Alignment: Facet joints are aligned without dislocation or traumatic listhesis. Dens and lateral masses are aligned. Skull base and vertebrae: No acute fracture. No primary bone lesion or focal pathologic process. Soft tissues and spinal canal: No prevertebral fluid or swelling. No visible canal hematoma. Disc levels: Mild-moderate multilevel degenerative disc and facet arthropathy. The right C4-5 facet joint is fused. Upper chest: Included lung apices are clear. Other: None. IMPRESSION: 1. No acute fracture or traumatic listhesis of the cervical spine. 2. Mild-moderate cervical spondylosis. Electronically Signed   By: Davina Poke D.O.   On: 09/24/2021 19:46    Procedures Procedures   Medications Ordered in ED Medications - No data to display  ED Course  I have reviewed the triage vital signs and the nursing notes.  Pertinent labs & imaging results that were available during my care of the patient were reviewed by me and considered in my medical decision making (see chart for details).    MDM Rules/Calculators/A&P                           Findings concerning for fractures of the seventh and eighth ribs.  Patient declined additional medications here.  Recommending outpatient follow-up with her doctor within the week.  Recommend immediate return for difficulty breathing fevers or any additional concerns.  Final Clinical Impression(s) / ED  Diagnoses Final diagnoses:  Closed fracture of multiple ribs of left side, initial encounter    Rx / DC Orders ED Discharge Orders     None        Luna Fuse, MD 09/24/21 2005

## 2021-10-06 ENCOUNTER — Other Ambulatory Visit: Payer: Self-pay | Admitting: Family Medicine

## 2021-10-06 ENCOUNTER — Ambulatory Visit
Admission: RE | Admit: 2021-10-06 | Discharge: 2021-10-06 | Disposition: A | Discharge status: Home / Self Care | Payer: 59 | Source: Ambulatory Visit | Attending: Family Medicine | Admitting: Family Medicine

## 2021-10-06 DIAGNOSIS — M25572 Pain in left ankle and joints of left foot: Secondary | ICD-10-CM

## 2022-06-01 ENCOUNTER — Other Ambulatory Visit: Payer: Self-pay | Admitting: Family Medicine

## 2022-06-01 DIAGNOSIS — Z1231 Encounter for screening mammogram for malignant neoplasm of breast: Secondary | ICD-10-CM

## 2022-06-17 ENCOUNTER — Ambulatory Visit
Admission: RE | Admit: 2022-06-17 | Discharge: 2022-06-17 | Disposition: A | Discharge status: Home / Self Care | Payer: 59 | Source: Ambulatory Visit | Attending: Family Medicine | Admitting: Family Medicine

## 2022-06-17 DIAGNOSIS — Z1231 Encounter for screening mammogram for malignant neoplasm of breast: Secondary | ICD-10-CM

## 2023-03-23 IMAGING — DX DG ANKLE 2V *L*
3 series · 3 of 3 positions shown · non-contrast
Comparison: None.

CLINICAL DATA: Left ankle pain, unspecified chronicity. Patient
reports pain and swelling after fall off ladder 09/24/2021.

EXAM:
LEFT ANKLE - 2 VIEW

[dg ankle 2 views left (1 of 2)]
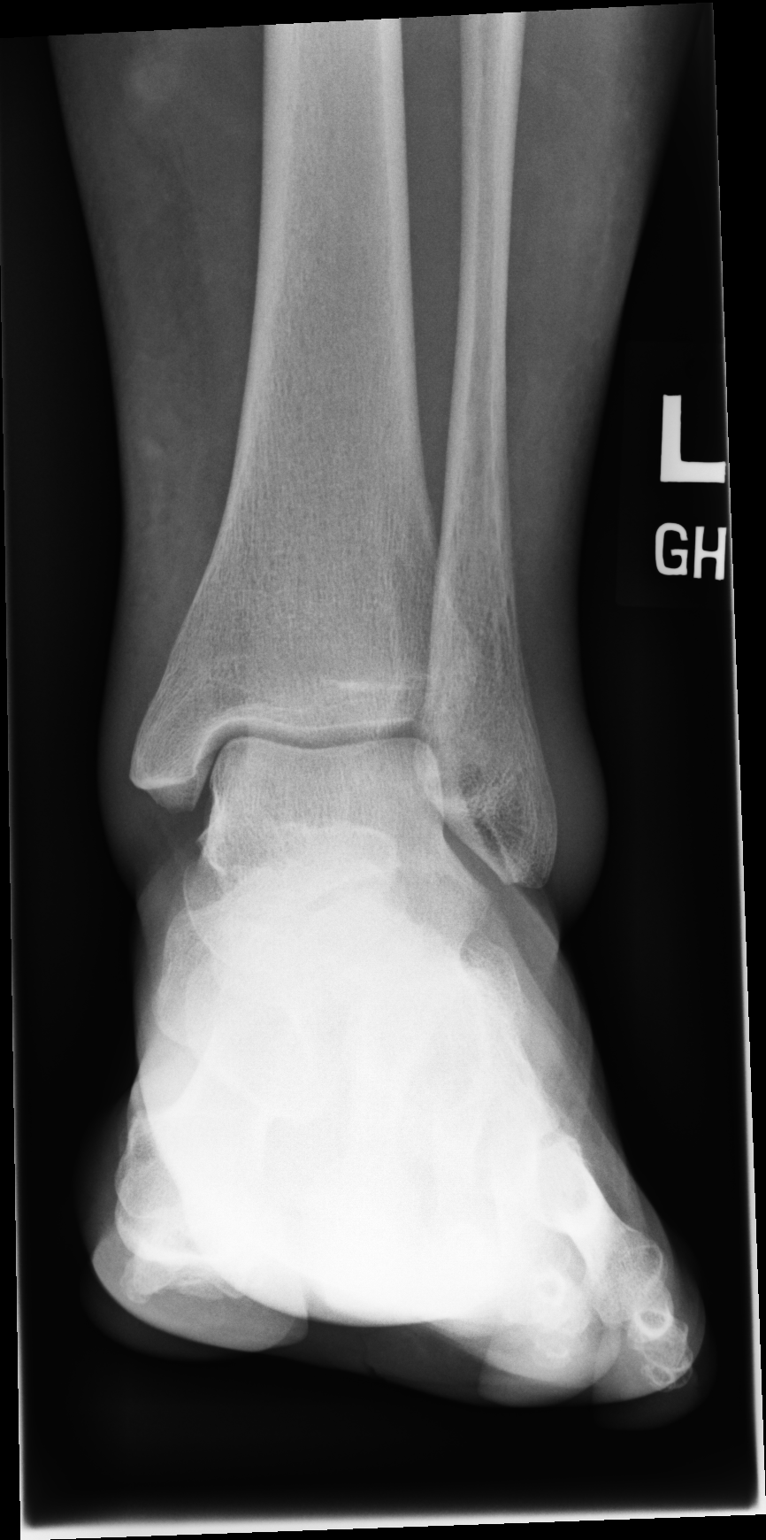

[view not recorded]
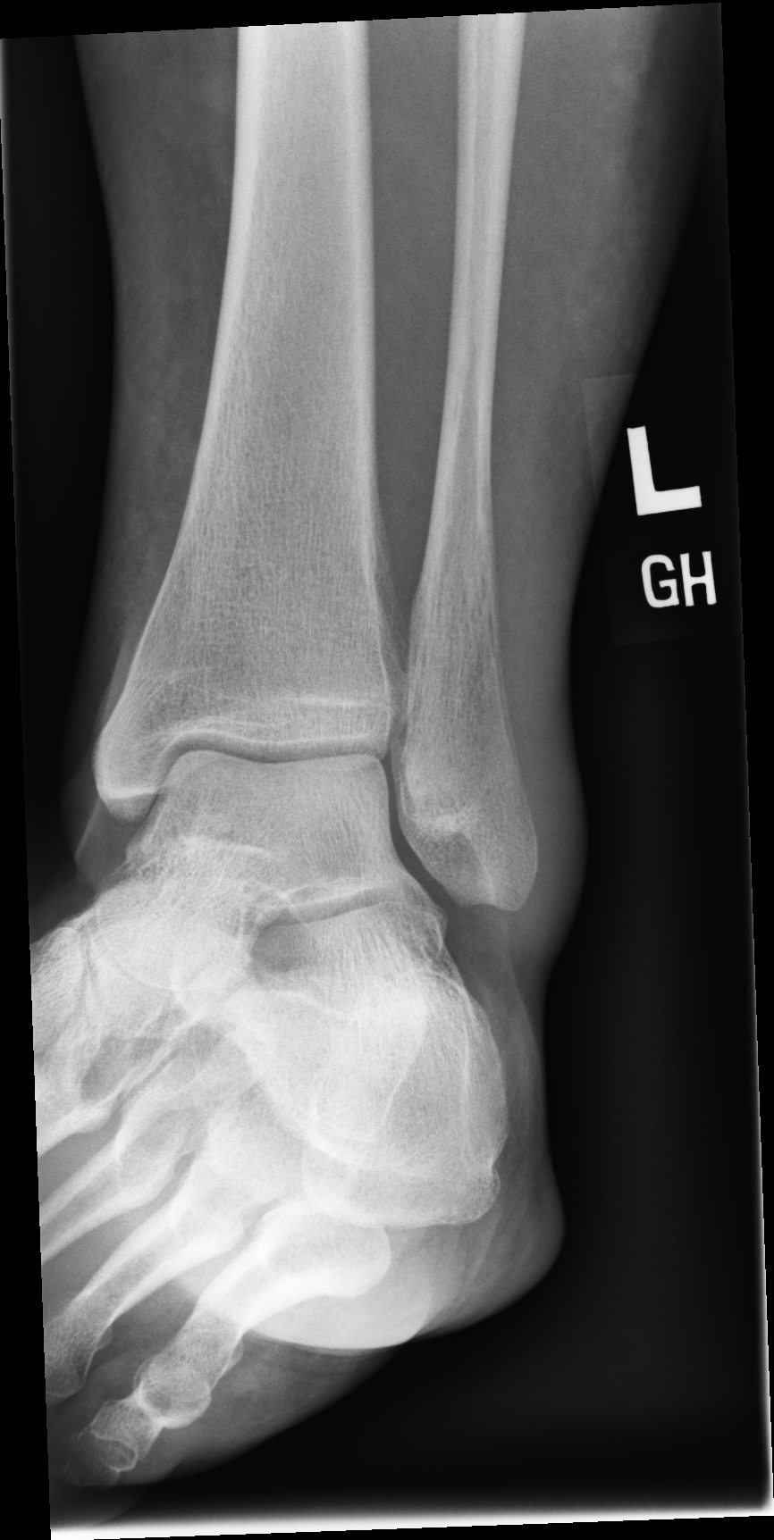

[dg ankle 2 views left (2 of 2)]
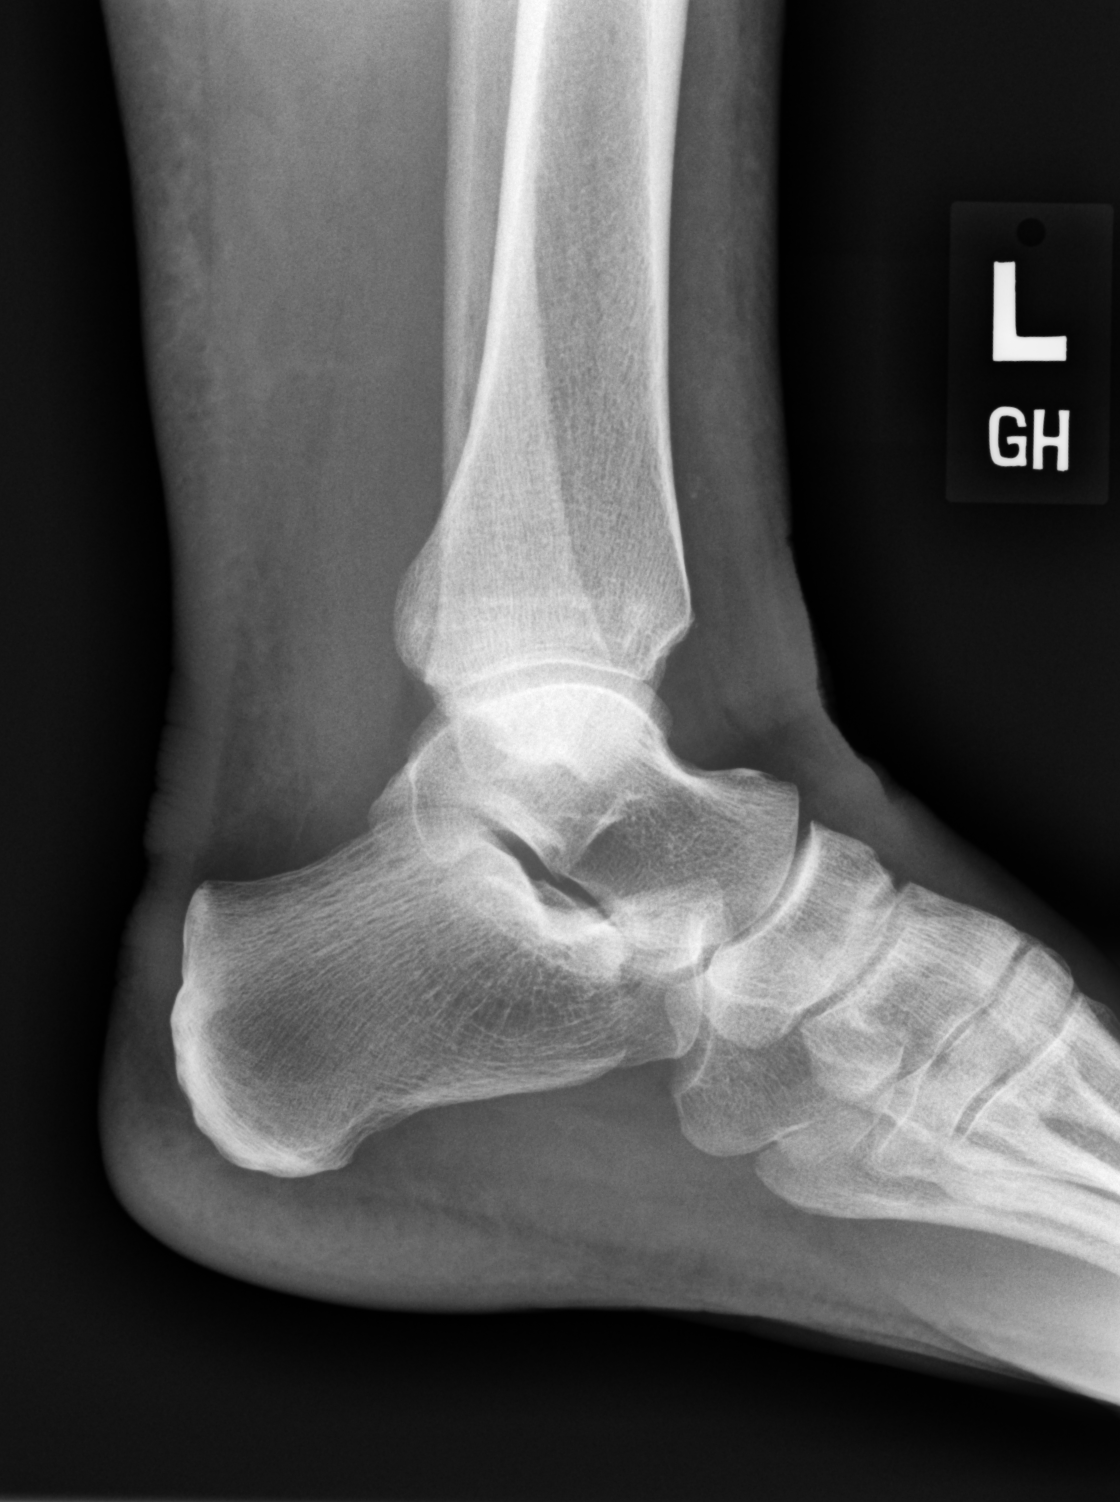

[3 of 3 positions shown; findings below may reference images not displayed]

FINDINGS: Mild periosteal thickening is noted of the distal fibula which may
represent healing fracture in the setting of recent injury. There is
adjacent lateral soft tissue edema. No additional fracture. The
ankle mortise is preserved. There is a small ankle joint effusion.
Intact base of the fifth metatarsal. Intact talar dome.
IMPRESSION: 1. Mild periosteal thickening of the distal fibula may represent
healing fracture in the setting of recent injury. Adjacent lateral
soft tissue edema.
2. Small ankle joint effusion.

## 2023-05-19 ENCOUNTER — Other Ambulatory Visit: Payer: Self-pay | Admitting: Obstetrics and Gynecology

## 2023-05-19 DIAGNOSIS — Z1231 Encounter for screening mammogram for malignant neoplasm of breast: Secondary | ICD-10-CM

## 2023-06-23 ENCOUNTER — Ambulatory Visit: Payer: 59

## 2023-07-08 ENCOUNTER — Ambulatory Visit
Admission: RE | Admit: 2023-07-08 | Discharge: 2023-07-08 | Disposition: A | Discharge status: Home / Self Care | Payer: 59 | Source: Ambulatory Visit | Attending: Obstetrics and Gynecology | Admitting: Obstetrics and Gynecology

## 2023-07-08 DIAGNOSIS — Z1231 Encounter for screening mammogram for malignant neoplasm of breast: Secondary | ICD-10-CM

## 2024-06-06 ENCOUNTER — Other Ambulatory Visit: Payer: Self-pay | Admitting: Family Medicine

## 2024-06-06 DIAGNOSIS — Z1231 Encounter for screening mammogram for malignant neoplasm of breast: Secondary | ICD-10-CM

## 2024-07-10 ENCOUNTER — Ambulatory Visit
Admission: RE | Admit: 2024-07-10 | Discharge: 2024-07-10 | Disposition: A | Discharge status: Home / Self Care | Source: Ambulatory Visit | Attending: Family Medicine | Admitting: Family Medicine

## 2024-07-10 DIAGNOSIS — Z1231 Encounter for screening mammogram for malignant neoplasm of breast: Secondary | ICD-10-CM
